# Patient Record
Sex: Female | Born: 1955 | Race: Black or African American | Hispanic: No | Marital: Single | Smoking: Never smoker
Health system: Southern US, Community
[De-identification: ages and names within clinical notes are randomized; demographics above are authoritative.]

## PROBLEM LIST (undated history)

## (undated) DIAGNOSIS — E119 Type 2 diabetes mellitus without complications: Secondary | ICD-10-CM

## (undated) DIAGNOSIS — I10 Essential (primary) hypertension: Secondary | ICD-10-CM

## (undated) DIAGNOSIS — C801 Malignant (primary) neoplasm, unspecified: Secondary | ICD-10-CM

---

## 2007-07-30 ENCOUNTER — Ambulatory Visit: Payer: Self-pay | Admitting: Family Medicine

## 2007-07-31 ENCOUNTER — Ambulatory Visit: Payer: Self-pay | Admitting: *Deleted

## 2007-09-03 ENCOUNTER — Ambulatory Visit: Payer: Self-pay | Admitting: Internal Medicine

## 2007-09-03 LAB — CONVERTED CEMR LAB
AST: 44 units/L — ABNORMAL HIGH (ref 0–37)
Alkaline Phosphatase: 255 units/L — ABNORMAL HIGH (ref 39–117)
Basophils Absolute: 0 10*3/uL (ref 0.0–0.1)
Basophils Relative: 0 % (ref 0–1)
Eosinophils Absolute: 0.2 10*3/uL (ref 0.0–0.7)
Eosinophils Relative: 4 % (ref 0–5)
Glucose, Bld: 142 mg/dL — ABNORMAL HIGH (ref 70–99)
HCT: 41.9 % (ref 36.0–46.0)
LDL Cholesterol: 150 mg/dL — ABNORMAL HIGH (ref 0–99)
Lymphs Abs: 1.3 10*3/uL (ref 0.7–4.0)
MCHC: 31.7 g/dL (ref 30.0–36.0)
MCV: 89.3 fL (ref 78.0–100.0)
Neutrophils Relative %: 57 % (ref 43–77)
Platelets: 336 10*3/uL (ref 150–400)
RDW: 15.1 % (ref 11.5–15.5)
Sodium: 137 meq/L (ref 135–145)
TSH: 2.013 microintl units/mL (ref 0.350–5.50)
Total Bilirubin: 0.5 mg/dL (ref 0.3–1.2)
Total Protein: 7.8 g/dL (ref 6.0–8.3)
Triglycerides: 167 mg/dL — ABNORMAL HIGH (ref <150)
VLDL: 33 mg/dL (ref 0–40)

## 2007-09-10 ENCOUNTER — Encounter (INDEPENDENT_AMBULATORY_CARE_PROVIDER_SITE_OTHER): Payer: Self-pay | Admitting: Family Medicine

## 2007-09-10 ENCOUNTER — Ambulatory Visit: Payer: Self-pay | Admitting: Internal Medicine

## 2007-09-10 LAB — CONVERTED CEMR LAB
Ferritin: 43 ng/mL (ref 10–291)
Hep B Core Total Ab: NEGATIVE
Hep B E Ab: NEGATIVE

## 2007-10-17 ENCOUNTER — Ambulatory Visit: Payer: Self-pay | Admitting: Internal Medicine

## 2014-02-13 ENCOUNTER — Emergency Department (HOSPITAL_COMMUNITY): Payer: BC Managed Care – PPO

## 2014-02-13 ENCOUNTER — Encounter (HOSPITAL_COMMUNITY): Payer: Self-pay | Admitting: Emergency Medicine

## 2014-02-13 ENCOUNTER — Emergency Department (HOSPITAL_COMMUNITY)
Admission: EM | Admit: 2014-02-13 | Discharge: 2014-02-14 | Disposition: A | Discharge status: Home / Self Care | Payer: BC Managed Care – PPO | Attending: Emergency Medicine | Admitting: Emergency Medicine

## 2014-02-13 DIAGNOSIS — K7689 Other specified diseases of liver: Secondary | ICD-10-CM | POA: Diagnosis not present

## 2014-02-13 DIAGNOSIS — R109 Unspecified abdominal pain: Secondary | ICD-10-CM | POA: Diagnosis present

## 2014-02-13 DIAGNOSIS — I10 Essential (primary) hypertension: Secondary | ICD-10-CM | POA: Insufficient documentation

## 2014-02-13 HISTORY — DX: Essential (primary) hypertension: I10

## 2014-02-13 LAB — CBC
HCT: 39.7 % (ref 36.0–46.0)
Hemoglobin: 13.4 g/dL (ref 12.0–15.0)
MCH: 28.9 pg (ref 26.0–34.0)
MCHC: 33.8 g/dL (ref 30.0–36.0)
MCV: 85.6 fL (ref 78.0–100.0)
PLATELETS: 318 10*3/uL (ref 150–400)
RBC: 4.64 MIL/uL (ref 3.87–5.11)
RDW: 14.2 % (ref 11.5–15.5)
WBC: 10.3 10*3/uL (ref 4.0–10.5)

## 2014-02-13 LAB — URINALYSIS, ROUTINE W REFLEX MICROSCOPIC
Bilirubin Urine: NEGATIVE
GLUCOSE, UA: NEGATIVE mg/dL
Hgb urine dipstick: NEGATIVE
Ketones, ur: NEGATIVE mg/dL
NITRITE: NEGATIVE
PH: 6.5 (ref 5.0–8.0)
Protein, ur: NEGATIVE mg/dL
SPECIFIC GRAVITY, URINE: 1.004 — AB (ref 1.005–1.030)
Urobilinogen, UA: 0.2 mg/dL (ref 0.0–1.0)

## 2014-02-13 LAB — COMPREHENSIVE METABOLIC PANEL
ALBUMIN: 3.7 g/dL (ref 3.5–5.2)
ALT: 24 U/L (ref 0–35)
ANION GAP: 16 — AB (ref 5–15)
AST: 41 U/L — AB (ref 0–37)
Alkaline Phosphatase: 177 U/L — ABNORMAL HIGH (ref 39–117)
BILIRUBIN TOTAL: 0.4 mg/dL (ref 0.3–1.2)
BUN: 13 mg/dL (ref 6–23)
CALCIUM: 10.7 mg/dL — AB (ref 8.4–10.5)
CHLORIDE: 97 meq/L (ref 96–112)
CO2: 23 mEq/L (ref 19–32)
CREATININE: 0.47 mg/dL — AB (ref 0.50–1.10)
GFR calc Af Amer: >90 mL/min (ref >90)
GFR calc non Af Amer: >90 mL/min (ref >90)
Glucose, Bld: 145 mg/dL — ABNORMAL HIGH (ref 70–99)
Potassium: 4.3 mEq/L (ref 3.7–5.3)
Sodium: 136 mEq/L — ABNORMAL LOW (ref 137–147)
TOTAL PROTEIN: 8.3 g/dL (ref 6.0–8.3)

## 2014-02-13 LAB — URINE MICROSCOPIC-ADD ON

## 2014-02-13 MED ORDER — HYDROMORPHONE HCL PF 1 MG/ML IJ SOLN
1.0000 mg | Freq: Once | INTRAMUSCULAR | Status: AC
Start: 2014-02-13 — End: 2014-02-14
  Administered 2014-02-14: 1 mg via INTRAVENOUS
  Filled 2014-02-13: qty 1

## 2014-02-13 MED ORDER — ONDANSETRON HCL 4 MG/2ML IJ SOLN
4.0000 mg | Freq: Once | INTRAMUSCULAR | Status: AC
  Administered 2014-02-14: 4 mg via INTRAVENOUS
  Filled 2014-02-13: qty 2

## 2014-02-13 NOTE — ED Notes (Signed)
MD at bedside. 

## 2014-02-13 NOTE — ED Notes (Signed)
Per pt she developed sudden right sided flank pain.  Pt rates 10/10 sharp and stabbing in nature.

## 2014-02-14 MED ORDER — POLYETHYLENE GLYCOL 3350 17 GM/SCOOP PO POWD: 17.0000 g | Freq: Every day | ORAL | Status: AC

## 2014-02-14 MED ORDER — METHOCARBAMOL 500 MG PO TABS: 1000.0000 mg | ORAL_TABLET | Freq: Four times a day (QID) | ORAL | Status: AC

## 2014-02-14 MED ORDER — HYDROCODONE-ACETAMINOPHEN 5-325 MG PO TABS
1.0000 | ORAL_TABLET | Freq: Once | ORAL | Status: AC
  Administered 2014-02-14: 1 via ORAL
  Filled 2014-02-14: qty 1

## 2014-02-14 MED ORDER — HYDROCODONE-ACETAMINOPHEN 5-325 MG PO TABS: ORAL_TABLET | ORAL | Status: DC

## 2014-02-14 NOTE — Discharge Instructions (Signed)
Please read and follow all provided instructions.  Your diagnoses today include:  1. Flank pain   2. Liver nodule    Tests performed today include:  Blood counts and electrolytes  Blood tests to check liver and kidney function  Blood tests to check pancreas function  Urine test to look for infection and pregnancy (in women)  Vital signs. See below for your results today.   Medications prescribed:   Vicodin (hydrocodone/acetaminophen) - narcotic pain medication  DO NOT drive or perform any activities that require you to be awake and alert because this medicine can make you drowsy. BE VERY CAREFUL not to take multiple medicines containing Tylenol (also called acetaminophen). Doing so can lead to an overdose which can damage your liver and cause liver failure and possibly death.   Miralax - laxative  This medication can be found over-the-counter.   Take any prescribed medications only as directed.  Home care instructions:   Follow any educational materials contained in this packet.  Follow-up instructions: Please follow-up with your primary care provider in the next 7 days for further evaluation of your symptoms. You will should have your abdominal pain rechecked. You will also have to have your liver evaluated, possibly with an MRI.   Return instructions:  SEEK IMMEDIATE MEDICAL ATTENTION IF:  The pain does not go away or becomes severe   A temperature above 101F develops   Repeated vomiting occurs (multiple episodes)   The pain becomes localized to portions of the abdomen. The right side could possibly be appendicitis. In an adult, the left lower portion of the abdomen could be colitis or diverticulitis.   Blood is being passed in stools or vomit (bright red or black tarry stools)   You develop chest pain, difficulty breathing, dizziness or fainting, or become confused, poorly responsive, or inconsolable (young children)  If you have any other emergent concerns  regarding your health  Additional Information: Abdominal (belly) pain can be caused by many things. Your caregiver performed an examination and possibly ordered blood/urine tests and imaging (CT scan, x-rays, ultrasound). Many cases can be observed and treated at home after initial evaluation in the emergency department. Even though you are being discharged home, abdominal pain can be unpredictable. Therefore, you need a repeated exam if your pain does not resolve, returns, or worsens. Most patients with abdominal pain don't have to be admitted to the hospital or have surgery, but serious problems like appendicitis and gallbladder attacks can start out as nonspecific pain. Many abdominal conditions cannot be diagnosed in one visit, so follow-up evaluations are very important.  Your vital signs today were: BP 125/64   Pulse 59   Temp(Src) 97.9 F (36.6 C) (Oral)   Resp 20   Ht 5\' 2"  (1.575 m)   Wt 153 lb (69.4 kg)   BMI 27.98 kg/m2   SpO2 94% If your blood pressure (bp) was elevated above 135/85 this visit, please have this repeated by your doctor within one month. --------------

## 2014-02-14 NOTE — ED Provider Notes (Signed)
Medical screening examination/treatment/procedure(s) were conducted as a shared visit with non-physician practitioner(s) and myself.  I personally evaluated the patient during the encounter.  Dc home. Improvement in pain.  Patient understands importance of close followup with her primary care physician for likely additional imaging her abdomen.  Patient understands the possibility that this could represent cancer.  She understands importance of followup.  Discharge home in good condition.  Hoy Morn, MD 02/14/14 [phone]

## 2014-02-14 NOTE — ED Provider Notes (Signed)
CSN: [id number]     Arrival date & time 02/13/14  2142 History   First MD Initiated Contact with Patient 02/13/14 2330     Chief Complaint  Patient presents with  . Flank Pain    (Consider location/radiation/quality/duration/timing/severity/associated sxs/prior Treatment) HPI Comments: Patient presents with complaints of onset of right flank pain with radiation to right lower abdomen which began at approximately 8 PM tonight. Patient has had similar pain in the past that has been much more mild. Pain was severe tonight which brought her to the emergency department. It has been associated with nausea but no vomiting. No fever or diarrhea. No urinary symptoms including dysuria or hematuria. No vaginal bleeding or discharge. Patient took Aleve prior to arrival without relief of pain. She does not have a history of abdominal surgeries or kidney stones. Onset of symptoms acute. Course is constant. Nothing makes symptoms better or worse.  Patient is a 58 y.o. female presenting with flank pain. The history is provided by the patient.  Flank Pain Associated symptoms include abdominal pain and nausea. Pertinent negatives include no chest pain, coughing, fever, headaches, myalgias, rash, sore throat or vomiting.    Past Medical History  Diagnosis Date  . Hypertension    History reviewed. No pertinent past surgical history. History reviewed. No pertinent family history. History  Substance Use Topics  . Smoking status: Never Smoker   . Smokeless tobacco: Not on file  . Alcohol Use: Yes   OB History   Grav Para Term Preterm Abortions TAB SAB Ect Mult Living                 Review of Systems  Constitutional: Negative for fever.  HENT: Negative for rhinorrhea and sore throat.   Eyes: Negative for redness.  Respiratory: Negative for cough.   Cardiovascular: Negative for chest pain.  Gastrointestinal: Positive for nausea and abdominal pain. Negative for vomiting and diarrhea.  Genitourinary:  Positive for flank pain. Negative for dysuria.  Musculoskeletal: Negative for myalgias.  Skin: Negative for rash.  Neurological: Negative for headaches.   Allergies  Lisinopril; Asa; and Azithromycin  Home Medications   Prior to Admission medications   Not on File   BP 141/85  Pulse 76  Temp(Src) 98.7 F (37.1 C) (Oral)  Resp 16  Ht 5\' 2"  (1.575 m)  Wt 153 lb (69.4 kg)  BMI 27.98 kg/m2  SpO2 98%  Physical Exam  Nursing note and vitals reviewed. Constitutional: She appears well-developed and well-nourished.  HENT:  Head: Normocephalic and atraumatic.  Eyes: Conjunctivae are normal. Right eye exhibits no discharge. Left eye exhibits no discharge.  Neck: Normal range of motion. Neck supple.  Cardiovascular: Normal rate, regular rhythm and normal heart sounds.   Pulmonary/Chest: Effort normal and breath sounds normal.  Abdominal: Soft. There is no tenderness. There is CVA tenderness (right). There is no rigidity, no rebound and no guarding.  Neurological: She is alert.  Skin: Skin is warm and dry.  Psychiatric: She has a normal mood and affect.    ED Course  Procedures (including critical care time) Labs Review Labs Reviewed  COMPREHENSIVE METABOLIC PANEL - Abnormal; Notable for the following:    Sodium 136 (*)    Glucose, Bld 145 (*)    Creatinine, Ser 0.47 (*)    Calcium 10.7 (*)    AST 41 (*)    Alkaline Phosphatase 177 (*)    Anion gap 16 (*)    All other components within normal limits  URINALYSIS,  ROUTINE W REFLEX MICROSCOPIC - Abnormal; Notable for the following:    APPearance CLOUDY (*)    Specific Gravity, Urine 1.004 (*)    Leukocytes, UA TRACE (*)    All other components within normal limits  URINE MICROSCOPIC-ADD ON - Abnormal; Notable for the following:    Squamous Epithelial / LPF FEW (*)    All other components within normal limits  CBC    Imaging Review Ct Renal Stone Study  02/14/2014   CLINICAL DATA:  R flank pain  EXAM: CT RENAL STONE  PROTOCOL  TECHNIQUE: Multidetector CT imaging of the abdomen and pelvis was performed following the standard protocol without intravenous contrast  COMPARISON:  None.  FINDINGS: The visualized lung bases are clear.  The liver demonstrates a nodular contour, suggestive of underlying cirrhosis. There is an ill-defined hypodense region within the central aspect of the liver, measuring approximately 8.2 x 6.3 x 7.5 cm (series 2, image 13). This lesion is poorly evaluated on this noncontrast examination. There is question of a separate 2.1 cm lesion near the caudate lobe (series 2, image 24).  Gallbladder within normal limits. No biliary dilatation. Spleen size within normal limits. Adrenal glands are unremarkable. Pancreas within normal limits.  Kidneys are equal in size without evidence of nephrolithiasis or hydronephrosis. No stones seen along the course of either renal collecting system. There is no hydroureter.  Stomach within normal limits. No evidence of bowel obstruction. Appendix is normal. No acute inflammatory changes seen about the bowels.  Bladder is unremarkable.  Uterus and ovaries within normal limits.  No free air identified.  No ascites.  Extensive ill-defined bulky adenopathy seen at the porta hepatis, measuring up to 2.2 cm in short axis. Aortocaval adenopathy present as well, measuring approximately 2.3 cm. Gastrohepatic lymph nodes measure up to 1.4 cm in short axis. Cardiophrenic nodes measure up to (series 2, image 7). Evaluation is somewhat limited due to lack of IV contrast.  No acute osseous abnormality. No worrisome lytic or blastic osseous lesion.  IMPRESSION: 1. Nodular contour of the liver, suggestive of cirrhosis. No ascites. 2. Ill-defined hypodense lesion within the central aspect of the liver, poorly evaluated on this noncontrast examination. Given the suspicion for cirrhosis, possible underlying primary hepatic malignancy should be considered. Further evaluation with liver mass  protocol MRI is recommended. 3. Extensive bulky gastrohepatic, porta hepatis, aortic caval, and cardiophrenic adenopathy, worrisome for nodal metastases. 4. No CT evidence of nephrolithiasis or obstructive uropathy.   Electronically Signed   By: Jeannine Boga M.D.   On: 02/14/2014 01:08     EKG Interpretation None      12:15 AM Patient seen and examined. CT ordered. Work-up reviewed. Medications ordered.   Vital signs reviewed and are as follows: BP 141/85  Pulse 76  Temp(Src) 98.7 F (37.1 C) (Oral)  Resp 16  Ht 5\' 2"  (1.575 m)  Wt 153 lb (69.4 kg)  BMI 27.98 kg/m2  SpO2 98%  1:46 AM Patient informed of results. She feels more comfortable now after pain medication.   Informed of CT results. Will tx with miralax, robaxin, vicodin. She thinks she has had liver US. She does not remember being told that she has cirrhosis. She is not a heavy drinker and denies history of that. I told her that she needs to see PCP for this in next 1-2 weeks to have further eval done.   Patient findings reviewed with Dr. Venora Maples who has seen and reiterated the need for follow-up.  Patient counseled on use of narcotic pain medications. Counseled not to combine these medications with others containing tylenol. Urged not to drink alcohol, drive, or perform any other activities that requires focus while taking these medications. The patient verbalizes understanding and agrees with the plan.  Patient counseled on proper use of muscle relaxant medication.  They were told not to drink alcohol, drive any vehicle, or do any dangerous activities while taking this medication.  Patient verbalized understanding.  The patient was urged to return to the Emergency Department immediately with worsening of current symptoms, worsening abdominal pain, persistent vomiting, blood noted in stools, fever, or any other concerns. The patient verbalized understanding.    MDM   Final diagnoses:  Flank pain  Liver nodule    Flank pain: no stones, no acute concerns on lab testing. Sx controlled. Liver findings discussed with patient as above. She agrees to follow-up with PCP soon.     Carlisle Cater, PA-C 02/14/14 [phone]

## 2014-02-14 NOTE — ED Notes (Signed)
Patient transported to CT 

## 2014-06-25 HISTORY — PX: OTHER SURGICAL HISTORY: SHX169

## 2015-07-03 IMAGING — CT CT RENAL STONE PROTOCOL
1 series · 14 of 20 positions shown, 19 images · non-contrast
Comparison: None.

CLINICAL DATA: R flank pain

EXAM:
CT RENAL STONE PROTOCOL
TECHNIQUE: Multidetector CT imaging of the abdomen and pelvis was performed
following the standard protocol without intravenous contrast

[Series 4: lung · axial · 0.72mm/px · z∈[-128,-43]mm · 14 of 20 slices shown, 19 images]
[im 2/20  soft-tissue]
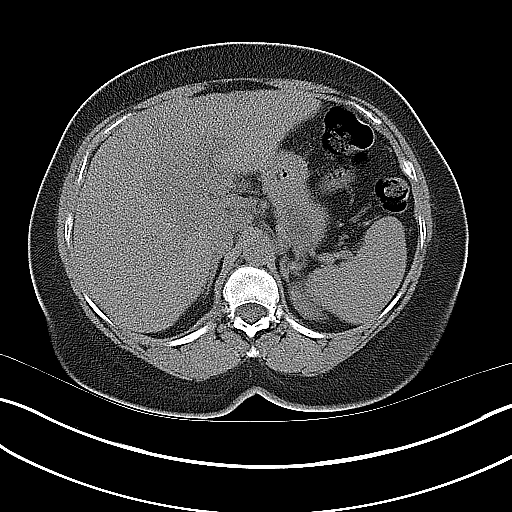
[im 2/20  bone]
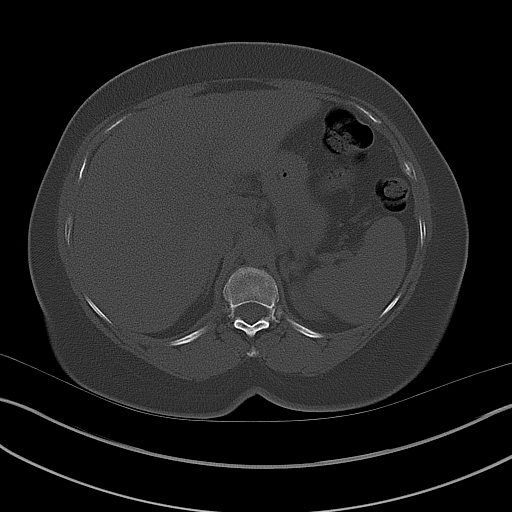
[im 4/20  soft-tissue]
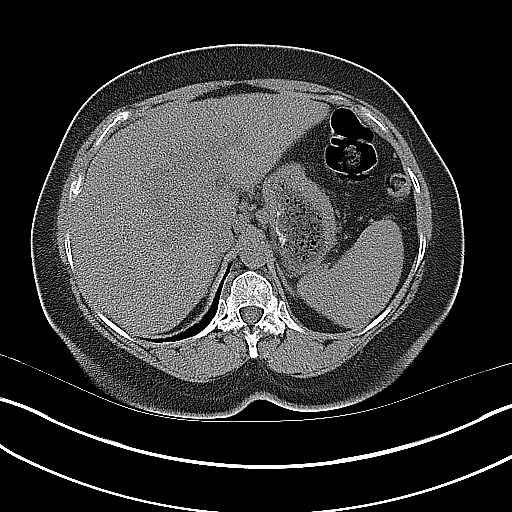
[im 5/20  soft-tissue]
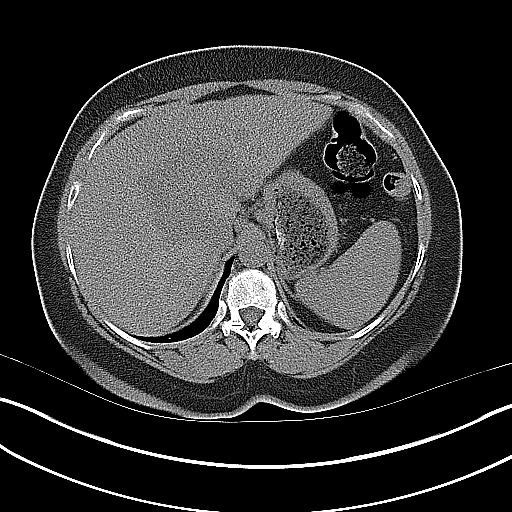
[im 6/20  soft-tissue]
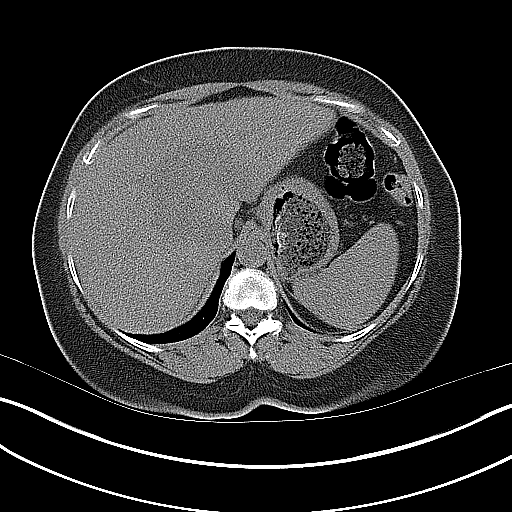
[im 8/20  soft-tissue]
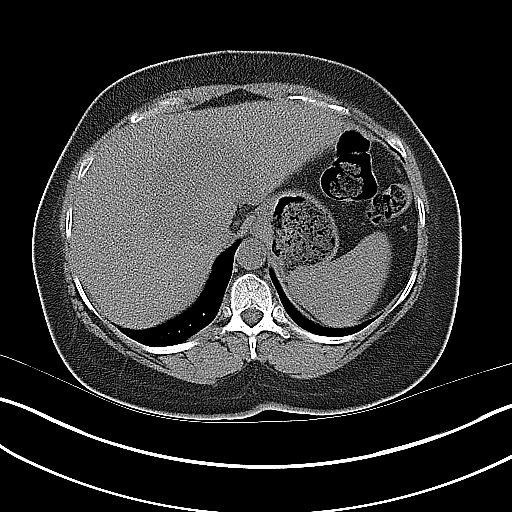
[im 9/20  soft-tissue]
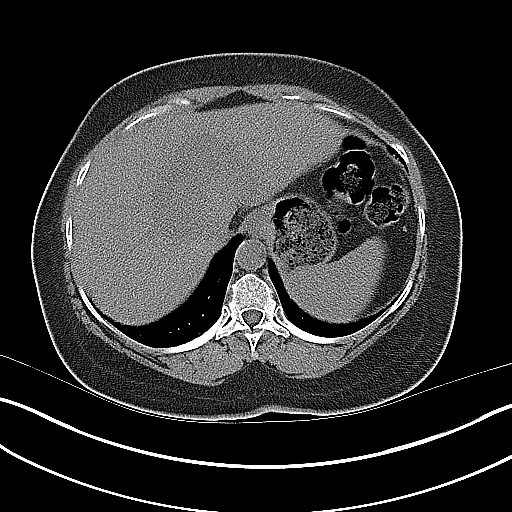
[im 11/20  soft-tissue]
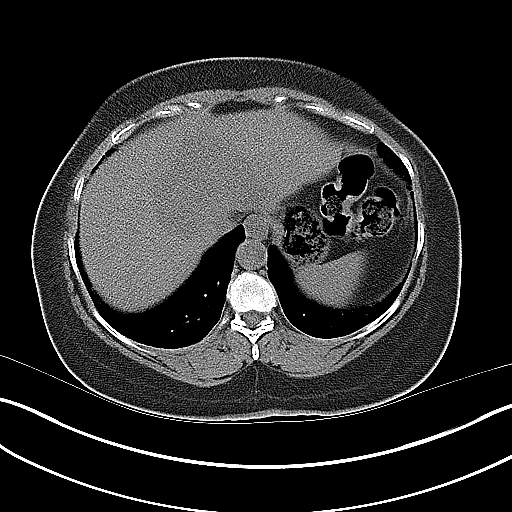
[im 12/20  soft-tissue]
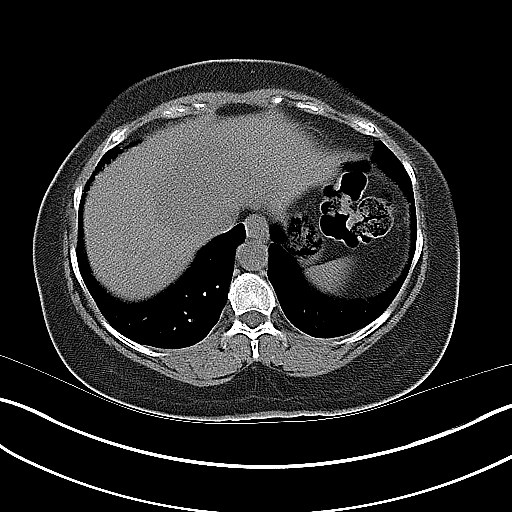
[im 13/20  soft-tissue]
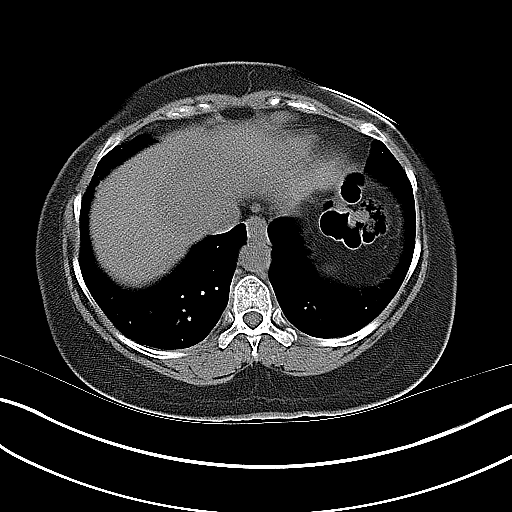
[im 13/20  bone]
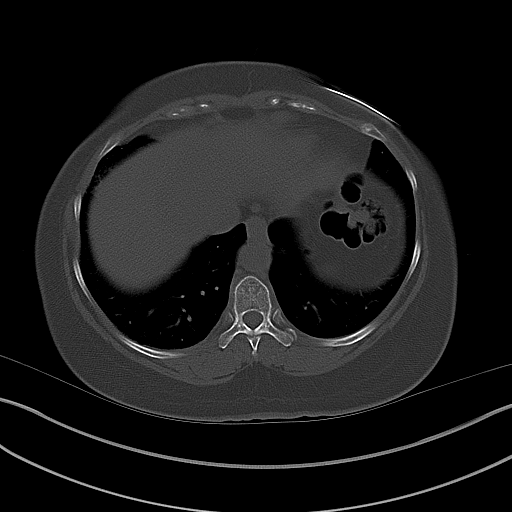
[im 15/20  soft-tissue]
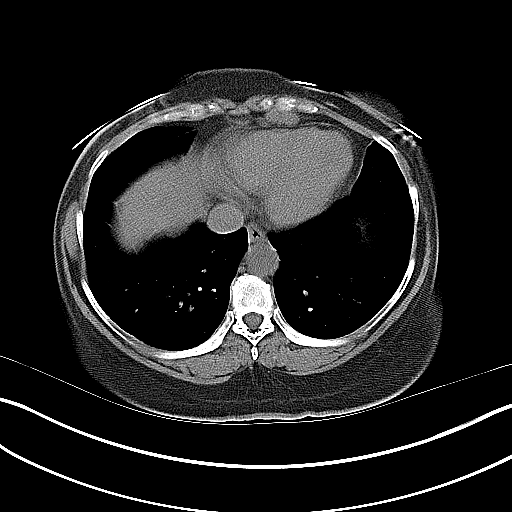
[im 16/20  soft-tissue]
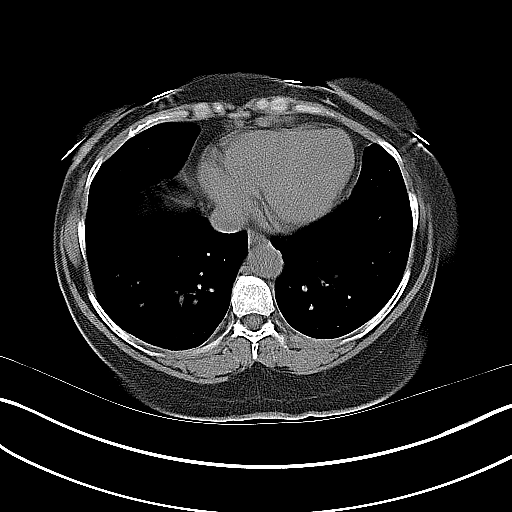
[im 16/20  lung]
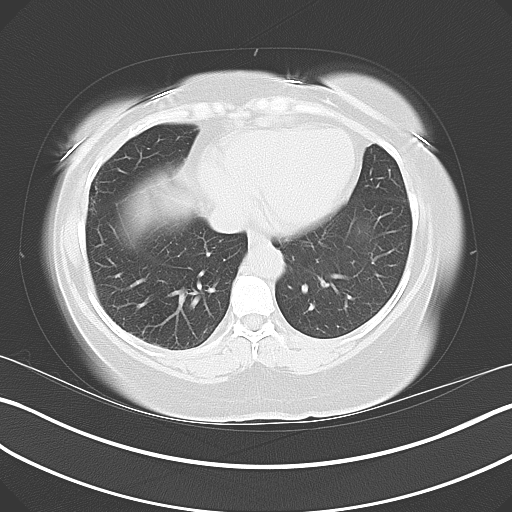
[im 17/20  soft-tissue]
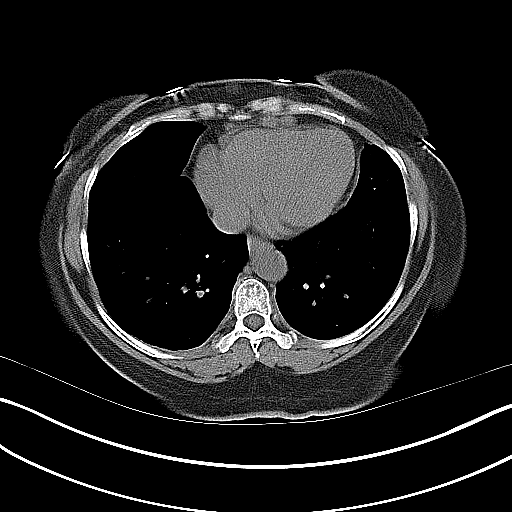
[im 17/20  lung]
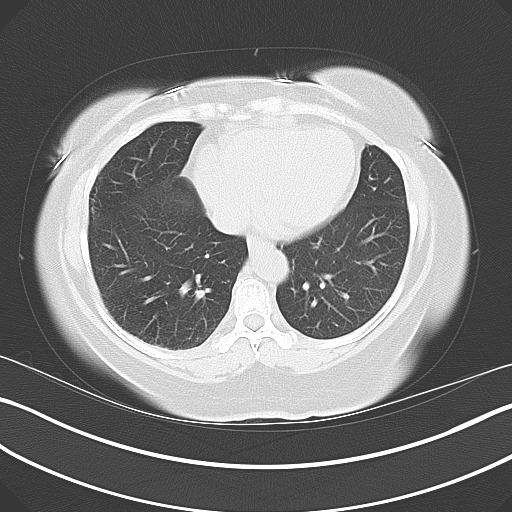
[im 18/20  lung]
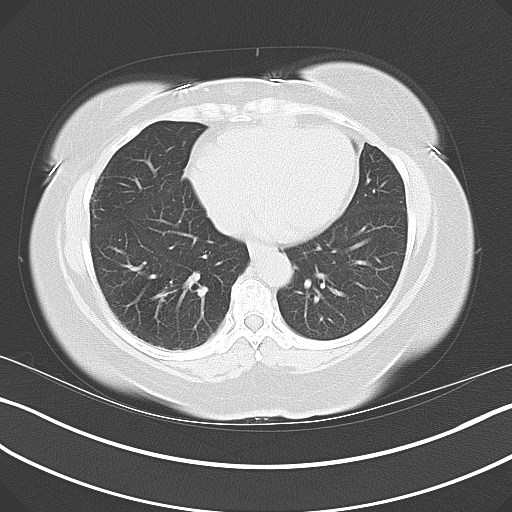
[im 19/20  soft-tissue]
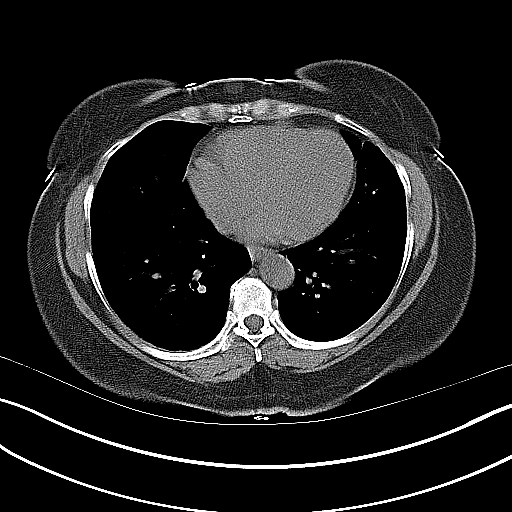
[im 19/20  lung]
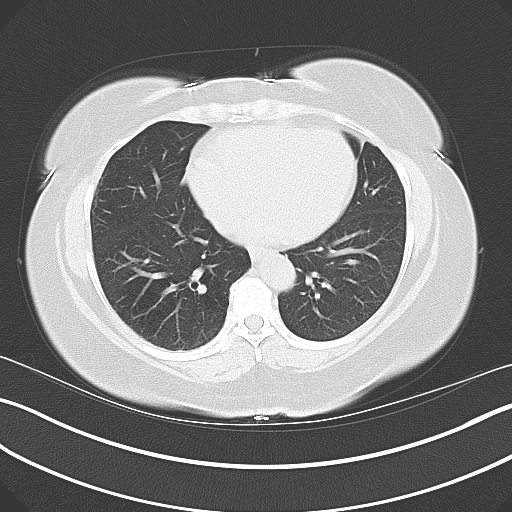

[14 of 20 positions shown; findings below may reference images not displayed]

FINDINGS: The visualized lung bases are clear.

The liver demonstrates a nodular contour, suggestive of underlying
cirrhosis. There is an ill-defined hypodense region within the
central aspect of the liver, measuring approximately 8.2 x 6.3 x
cm (series 2, image 13). This lesion is poorly evaluated on this
noncontrast examination. There is question of a separate 2.1 cm
lesion near the caudate lobe (series 2, image 24).

Gallbladder within normal limits. No biliary dilatation. Spleen size
within normal limits. Adrenal glands are unremarkable. Pancreas
within normal limits.

Kidneys are equal in size without evidence of nephrolithiasis or
hydronephrosis. No stones seen along the course of either renal
collecting system. There is no hydroureter.

Stomach within normal limits. No evidence of bowel obstruction.
Appendix is normal. No acute inflammatory changes seen about the
bowels.

Bladder is unremarkable.  Uterus and ovaries within normal limits.

No free air identified.  No ascites.

Extensive ill-defined bulky adenopathy seen at the porta hepatis,
measuring up to 2.2 cm in short axis. Aortocaval adenopathy present
as well, measuring approximately 2.3 cm. Gastrohepatic lymph nodes
measure up to 1.4 cm in short axis. Cardiophrenic nodes measure up
to (series 2, image 7). Evaluation is somewhat limited due to lack
of IV contrast.

No acute osseous abnormality. No worrisome lytic or blastic osseous
lesion.
IMPRESSION: 1. Nodular contour of the liver, suggestive of cirrhosis. No
ascites.
2. Ill-defined hypodense lesion within the central aspect of the
liver, poorly evaluated on this noncontrast examination. Given the
suspicion for cirrhosis, possible underlying primary hepatic
malignancy should be considered. Further evaluation with liver mass
protocol MRI is recommended.
3. Extensive bulky gastrohepatic, porta hepatis, aortic caval, and
cardiophrenic adenopathy, worrisome for nodal metastases.
4. No CT evidence of nephrolithiasis or obstructive uropathy.

## 2016-04-20 ENCOUNTER — Encounter (HOSPITAL_COMMUNITY): Payer: Self-pay | Admitting: *Deleted

## 2016-04-20 ENCOUNTER — Emergency Department (HOSPITAL_COMMUNITY)
Admission: EM | Admit: 2016-04-20 | Discharge: 2016-04-20 | Disposition: A | Discharge status: Home / Self Care | Payer: Medicaid Other | Attending: Emergency Medicine | Admitting: Emergency Medicine

## 2016-04-20 DIAGNOSIS — Z7984 Long term (current) use of oral hypoglycemic drugs: Secondary | ICD-10-CM | POA: Diagnosis not present

## 2016-04-20 DIAGNOSIS — J0100 Acute maxillary sinusitis, unspecified: Secondary | ICD-10-CM | POA: Insufficient documentation

## 2016-04-20 DIAGNOSIS — E119 Type 2 diabetes mellitus without complications: Secondary | ICD-10-CM | POA: Insufficient documentation

## 2016-04-20 DIAGNOSIS — I10 Essential (primary) hypertension: Secondary | ICD-10-CM | POA: Insufficient documentation

## 2016-04-20 DIAGNOSIS — Z8509 Personal history of malignant neoplasm of other digestive organs: Secondary | ICD-10-CM | POA: Insufficient documentation

## 2016-04-20 DIAGNOSIS — R51 Headache: Secondary | ICD-10-CM | POA: Diagnosis present

## 2016-04-20 HISTORY — DX: Type 2 diabetes mellitus without complications: E11.9

## 2016-04-20 HISTORY — DX: Malignant (primary) neoplasm, unspecified: C80.1

## 2016-04-20 MED ORDER — HYDROCODONE-ACETAMINOPHEN 5-325 MG PO TABS: 1.0000 | ORAL_TABLET | Freq: Four times a day (QID) | ORAL | 0 refills | Status: AC | PRN

## 2016-04-20 MED ORDER — HYDROCODONE-ACETAMINOPHEN 5-325 MG PO TABS
1.0000 | ORAL_TABLET | Freq: Once | ORAL | Status: AC
  Administered 2016-04-20: 1 via ORAL
  Filled 2016-04-20: qty 1

## 2016-04-20 MED ORDER — LEVOFLOXACIN 500 MG PO TABS
500.0000 mg | ORAL_TABLET | Freq: Once | ORAL | Status: AC
  Administered 2016-04-20: 500 mg via ORAL
  Filled 2016-04-20: qty 1

## 2016-04-20 MED ORDER — LEVOFLOXACIN 500 MG PO TABS
500.0000 mg | ORAL_TABLET | Freq: Every day | ORAL | 0 refills | Status: AC
Start: 2016-04-20 — End: ?

## 2016-04-20 NOTE — ED Triage Notes (Signed)
Pt states she has L nostril/sinus pain since Monday.  Denies bleeding or drainage.  Describes pain like toothache. 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

## 2016-04-20 NOTE — ED Notes (Signed)
Pt verbalizes discharge instructions. NAD at departure. Being transported home by family. A/o x4.

## 2016-04-20 NOTE — ED Provider Notes (Signed)
[facility] DEPT Provider Note   CSN: [id number] Arrival date & time: 04/20/16  1213     History   Chief Complaint Chief Complaint  Patient presents with  . Facial Pain    HPI Renee Morrison is a 60 y.o. female.  The history is provided by the patient. No language interpreter was used.   Renee Morrison is a 60 y.o. female who presents to the Emergency Department complaining of facial pain.  She reports 5 days of waxing and waning left maxillary facial pain. The pain is described as a throbbing and toothache type sensation. She reports mild increased nasal discharge. No fevers, photophobia, vision changes, sore throat, cough. She has taken ibuprofen at home with partial transient improvement in her symptoms. She is currently undergoing treatment for cholangiocarcinoma. Symptoms are severe, constant, worsening. Past Medical History:  Diagnosis Date  . Cancer ([place])   . Diabetes mellitus without complication ([place])   . Hypertension     There are no active problems to display for this patient.   Past Surgical History:  Procedure Laterality Date  . port-a-cath placement  06/2014    OB History    No data available       Home Medications    Prior to Admission medications   Medication Sig Start Date End Date Taking? Authorizing Provider  amoxicillin-clavulanate (AUGMENTIN) 875-125 MG per tablet Take 1 tablet by mouth 2 (two) times daily. 7 day therapy course patient completed today    Historical Provider, MD  calcium carbonate (OS-CAL) 600 MG TABS tablet Take 600 mg by mouth daily.    Historical Provider, MD  Cholecalciferol (VITAMIN D3) 2000 UNITS TABS Take 2,000 tablets by mouth daily.    Historical Provider, MD  fluconazole (DIFLUCAN) 150 MG tablet Take 150 mg by mouth daily.    Historical Provider, MD  HYDROcodone-acetaminophen (NORCO/VICODIN) 5-325 MG tablet Take 1 tablet by mouth every 6 (six) hours as needed. 04/20/16   Quintella Reichert, MD  levofloxacin (LEVAQUIN) 500  MG tablet Take 1 tablet (500 mg total) by mouth daily. 04/20/16   Quintella Reichert, MD  losartan (COZAAR) 25 MG tablet Take 25 mg by mouth daily.    Historical Provider, MD  metFORMIN (GLUCOPHAGE) 500 MG tablet Take 500 mg by mouth 2 (two) times daily with a meal.    Historical Provider, MD  methocarbamol (ROBAXIN) 500 MG tablet Take 2 tablets (1,000 mg total) by mouth 4 (four) times daily. 02/14/14   Carlisle Cater, PA-C  naproxen sodium (ANAPROX) 220 MG tablet Take 440 mg by mouth 2 (two) times daily as needed (for pain).    Historical Provider, MD  polyethylene glycol powder (GLYCOLAX/MIRALAX) powder Take 17 g by mouth daily. 02/14/14   Carlisle Cater, PA-C    Family History No family history on file.  Social History Social History  Substance Use Topics  . Smoking status: Never Smoker  . Smokeless tobacco: Never Used  . Alcohol use No     Allergies   Lisinopril; Augmentin [amoxicillin-pot clavulanate]; Asa [aspirin]; and Azithromycin   Review of Systems Review of Systems  All other systems reviewed and are negative.    Physical Exam Updated Vital Signs BP 155/86   Pulse 91   Temp 98.3 F (36.8 C) (Oral)   Resp 16   Ht 5\' 2"  (1.575 m)   Wt 169 lb (76.7 kg)   SpO2 99%   BMI 30.91 kg/m   Physical Exam  Constitutional: She is oriented to person, place, and time. She  appears well-developed and well-nourished.  HENT:  Head: Normocephalic and atraumatic.  Right Ear: External ear normal.  Left Ear: External ear normal.  Mouth/Throat: Oropharynx is clear and moist.  No facial swelling. There is mild tenderness over the left maxillary sinus. There is a small amount of left purulent nasal discharge.  Eyes: Conjunctivae and EOM are normal. Pupils are equal, round, and reactive to light.  Neck: Neck supple.  Cardiovascular: Normal rate and regular rhythm.   No murmur heard. Pulmonary/Chest: Effort normal and breath sounds normal. No respiratory distress.  Abdominal: Soft.  There is no tenderness. There is no rebound and no guarding.  Musculoskeletal: She exhibits no edema or tenderness.  Neurological: She is alert and oriented to person, place, and time.  Skin: Skin is warm and dry.  Psychiatric: She has a normal mood and affect. Her behavior is normal.  Nursing note and vitals reviewed.    ED Treatments / Results  Labs (all labs ordered are listed, but only abnormal results are displayed) Labs Reviewed - No data to display  EKG  EKG Interpretation None       Radiology No results found.  Procedures Procedures (including critical care time)  Medications Ordered in ED Medications  levofloxacin (LEVAQUIN) tablet 500 mg (not administered)  HYDROcodone-acetaminophen (NORCO/VICODIN) 5-325 MG per tablet 1 tablet (not administered)     Initial Impression / Assessment and Plan / ED Course  I have reviewed the triage vital signs and the nursing notes.  Pertinent labs & imaging results that were available during my care of the patient were reviewed by me and considered in my medical decision making (see chart for details).  Clinical Course    Patient with history of cholangiocarcinoma here with left-sided facial pain. Examination and history is concerning for sinusitis. She has an Augmentin allergy, will treat with Levaquin. Discussed outpatient follow-up, home care, return precautions.`  Final Clinical Impressions(s) / ED Diagnoses   Final diagnoses:  Acute maxillary sinusitis, recurrence not specified    New Prescriptions New Prescriptions   HYDROCODONE-ACETAMINOPHEN (NORCO/VICODIN) 5-325 MG TABLET    Take 1 tablet by mouth every 6 (six) hours as needed.   LEVOFLOXACIN (LEVAQUIN) 500 MG TABLET    Take 1 tablet (500 mg total) by mouth daily.     Quintella Reichert, MD 04/20/16 [phone]

## 2017-02-23 DEATH — deceased
